# Patient Record
Sex: Female | Born: 1960 | Race: White | Hispanic: No | Marital: Married | Smoking: Former smoker
Health system: Southern US, Community
[De-identification: ages and names within clinical notes are randomized; demographics above are authoritative.]

## PROBLEM LIST (undated history)

## (undated) DIAGNOSIS — I10 Essential (primary) hypertension: Secondary | ICD-10-CM

## (undated) DIAGNOSIS — E785 Hyperlipidemia, unspecified: Secondary | ICD-10-CM

## (undated) DIAGNOSIS — L0291 Cutaneous abscess, unspecified: Secondary | ICD-10-CM

## (undated) DIAGNOSIS — M1990 Unspecified osteoarthritis, unspecified site: Secondary | ICD-10-CM

## (undated) DIAGNOSIS — E119 Type 2 diabetes mellitus without complications: Secondary | ICD-10-CM

## (undated) HISTORY — DX: Cutaneous abscess, unspecified: L02.91

## (undated) HISTORY — DX: Type 2 diabetes mellitus without complications: E11.9

## (undated) HISTORY — DX: Unspecified osteoarthritis, unspecified site: M19.90

## (undated) HISTORY — DX: Hyperlipidemia, unspecified: E78.5

## (undated) HISTORY — DX: Essential (primary) hypertension: I10

---

## 1997-08-11 HISTORY — PX: GANGLION CYST EXCISION: SHX1691

## 1998-01-11 ENCOUNTER — Other Ambulatory Visit: Admission: RE | Admit: 1998-01-11 | Discharge: 1998-01-11 | Payer: Self-pay | Admitting: Obstetrics and Gynecology

## 2000-09-01 ENCOUNTER — Other Ambulatory Visit: Admission: RE | Admit: 2000-09-01 | Discharge: 2000-09-01 | Payer: Self-pay | Admitting: Obstetrics and Gynecology

## 2001-09-20 ENCOUNTER — Other Ambulatory Visit: Admission: RE | Admit: 2001-09-20 | Discharge: 2001-09-20 | Payer: Self-pay | Admitting: Obstetrics and Gynecology

## 2002-02-01 ENCOUNTER — Encounter: Payer: Self-pay | Admitting: Obstetrics and Gynecology

## 2002-02-01 ENCOUNTER — Ambulatory Visit (HOSPITAL_COMMUNITY): Admission: RE | Admit: 2002-02-01 | Discharge: 2002-02-01 | Payer: Self-pay | Admitting: Obstetrics and Gynecology

## 2003-08-15 ENCOUNTER — Other Ambulatory Visit: Admission: RE | Admit: 2003-08-15 | Discharge: 2003-08-15 | Payer: Self-pay | Admitting: Obstetrics and Gynecology

## 2003-09-11 ENCOUNTER — Encounter: Admission: RE | Admit: 2003-09-11 | Discharge: 2003-09-11 | Payer: Self-pay | Admitting: Obstetrics and Gynecology

## 2004-01-18 ENCOUNTER — Encounter: Admission: RE | Admit: 2004-01-18 | Discharge: 2004-01-18 | Payer: Self-pay | Admitting: Obstetrics and Gynecology

## 2004-05-21 ENCOUNTER — Encounter: Admission: RE | Admit: 2004-05-21 | Discharge: 2004-05-21 | Payer: Self-pay | Admitting: Family Medicine

## 2005-01-16 ENCOUNTER — Encounter: Admission: RE | Admit: 2005-01-16 | Discharge: 2005-01-16 | Payer: Self-pay | Admitting: Obstetrics and Gynecology

## 2005-07-16 ENCOUNTER — Inpatient Hospital Stay (HOSPITAL_COMMUNITY): Admission: RE | Admit: 2005-07-16 | Discharge: 2005-07-17 | Payer: Self-pay | Admitting: Obstetrics and Gynecology

## 2005-07-16 ENCOUNTER — Encounter (INDEPENDENT_AMBULATORY_CARE_PROVIDER_SITE_OTHER): Payer: Self-pay | Admitting: Specialist

## 2005-08-11 HISTORY — PX: ABDOMINAL HYSTERECTOMY: SHX81

## 2007-09-14 ENCOUNTER — Other Ambulatory Visit: Admission: RE | Admit: 2007-09-14 | Discharge: 2007-09-14 | Payer: Self-pay | Admitting: Family Medicine

## 2008-11-28 ENCOUNTER — Other Ambulatory Visit: Admission: RE | Admit: 2008-11-28 | Discharge: 2008-11-28 | Payer: Self-pay | Admitting: Family Medicine

## 2010-01-01 ENCOUNTER — Encounter: Admission: RE | Admit: 2010-01-01 | Discharge: 2010-01-01 | Payer: Self-pay | Admitting: Family Medicine

## 2010-01-14 ENCOUNTER — Other Ambulatory Visit: Admission: RE | Admit: 2010-01-14 | Discharge: 2010-01-14 | Payer: Self-pay | Admitting: Family Medicine

## 2010-12-27 NOTE — Op Note (Signed)
NAMECHARLIZE, Samantha Hobbs                 ACCOUNT NO.:  [id number]   MEDICAL RECORD NO.:  [id number]          PATIENT TYPE:  INP   LOCATION:  9399                          FACILITY:  [facility]   PHYSICIAN:  Sherry A. Dickstein, M.D.DATE OF BIRTH:  [birth year]   DATE OF PROCEDURE:  07/16/2005  DATE OF DISCHARGE:                                 OPERATIVE REPORT   PREOPERATIVE DIAGNOSIS:  Fibroid uterus.   POSTOPERATIVE DIAGNOSIS:  Fibroid uterus.   PROCEDURE:  Laparoscopic supracervical hysterectomy.   SURGEON:  Sherry A. Rosalio Macadamia, M.D. and Gerri Spore B. Earlene Plater, M.D.   ANESTHESIA:  General.   INDICATIONS:  This is a 50 year old gravida 1, para 0-0-1-0 woman who has  had a known fibroid uterus for approximately 8 years. The patient's  menstrual period has been getting slightly heavier but the patient has been  complaining of increasing abdominal pain, back pain and pelvic pressure. The  patient has had ultrasounds which show increasing size of the uterus.  Because of the increase in size of the uterus and the discomfort, the  patient is ready to have surgical intervention. Initially she requested  myomectomy but because the patient is not interested in any pregnancies, the  patient requested hysterectomy at this time. She requested leaving her  cervix in place and therefore requests a laparoscopic supracervical  hysterectomy if at all possible.   FINDINGS:  Approximately 11-12 weeks size uterus with one pedunculated  fibroid approximately 10 cm in diameter. The retroverted uterus with a large  fibroid off the posterior wall, normal fallopian tubes and ovaries, some  adhesions of the large intestine to the upper right abdomen.   PROCEDURE:  The patient is brought into the operating room, given adequate  general anesthesia. She was placed in the dorsal lithotomy position. Her  abdomen, perineum and vagina were washed with Betadine and the patient was  draped in sterile fashion after inserting a  Foley catheter. The surgeon's  gown and gloves were changed. Subumbilical area was infiltrated with  Marcaine. Incision was made. Fascia was incised. Fascial edges were  identified and a pursestring stitch was taken with 0 Vicryl. The peritoneum  was identified and entered bluntly. The Roseanne Reno was introduced into the  peritoneal cavity. The laparoscope was introduced and the pelvic organs were  identified. Carbon dioxide was insufflated. The left median incision was  made after infiltrating with 0.25% Marcaine and under direct visual  visualization, 5 mm trocar was introduced. Pelvis was inspected. Adhesions  on the right side of the abdominal cavity were identified. These were  dissected off of the abdominal cavity with sharp and blunt dissection  including unipolar cautery very carefully. The bowel and adhesions were  dropped down into the abdominal cavity. Using 0.25% Marcaine, a 10 mm  incision was made and under direct visualization a 10 mm trocar was  introduced into the peritoneal cavity. The pelvis was again inspected and  decision was made that [facility] could be performed. The right fallopian tube and  round ligament were cauterized and cut using the harmonic scalpel. The utero-  ovarian ligaments were then  cauterized using minimum power. The broad  ligament was cauterized and the anterior leaf of broad ligament was  cauterized dropping the bladder down off of the lower uterine segment. The  uterine artery was easily identified and this was cauterized using minimal  power as it was easily identified. The left side of the uterus was  identified. There was a large pedunculated fibroid. The base of the  pedunculated fibroid was visualized. It was cauterized initially using  minimal power on the harmonic scalpel. This bled very minimally. Using  Kleppingers with bipolar cautery, the base was cauterized with adequate  hemostasis. The fibroid was dissected off of the uterus in this fashion  and  just left to be able to be morcellated later. The left round ligament and  left utero-ovarian ligaments were cauterized and cut using the harmonic  scalpel on minimum. This was performed down to the uterine artery which was  very easily visualized. The anterior leaf of the broad ligament was  cauterized and skeletonized. The bladder was developed off the lower uterine  segment with blunt dissection. There was small amount of bleeding near the  uterine arteries. This was cauterized with bipolar cautery and adequate  hemostasis was present. The reverse cone technique was developed from the  right side of the uterus because this was the most easily seen anatomy using  the harmonic scalpel. The cervix was dissected above the uterine artery and  above the uterosacral ligaments across the cervix. The large posterior  fibroid was in the way to some degree but once this was manipulated, the  posterior serosa was also dissected free and cauterized with a harmonic  scalpel and the uterus and fibroids were dissected free from the cervix  using a harmonic scalpel over to the left side of the cervix. The fundus of  the uterus was completely separated in this fashion. There was small amount  of bleeding near the left uterine artery. This was cauterized with the  bipolar cautery and also a small amount of bleeding near the right uterine  artery. This was cauterized for adequate hemostasis. The right sided trocar  was removed and the morcellator was then placed under direct visualization.  The uterus was then morcellated in strips. The large fibroid that was  present was very calcified and very difficult to remove. The morcellator  began to be locked up and had some technical difficulties in removing this  fibroid and making the morcellated work properly. Once this was removed  completely, it was attempted to start morcellating the second large pedunculated fibroid that was as large as the original  uterus. It was felt  that the morcellated was not working properly and was removed and a new  morcellated was used and after it was switched to the new morcellated, the  second pedunculated fibroid was then morcellated without difficulty. The  small pieces of fibroid that were throughout the pelvis were identified and  removed using the large spoon-like instrument and atraumatic graspers. The  pelvis was irrigated. All small pieces were removed. Small bleeders were  cauterized. The endocervix was then cauterized with the harmonic scalpel on  max for approximately 25 seconds. The entire pelvis was inspected. The upper  abdomen was inspected. Any other pieces of fibroid were removed. The area of  adhesions that had been dissected at the very beginning of surgery was  inspected. There was no significant bleeding present. Both ovaries were  visualized and felt to be normal. There was follicular cyst  on the right  ovary that was felt to be normal. Both fallopian tubes were normal. Toradol  was administered to the patient at this time. All fluid was removed from the  abdomen. Approximately 20 mL of 0.25% Marcaine was placed in the pelvis. A  sheet of intercede was placed across the cervical stump to minimize any  adhesions. The morcellator was removed under direct visualization. The edges  of the fascia were identified and Kocher clamps placed on the edges using 0  Vicryl figure-of-eight stitch was taken and tied. The rest of the remaining  carbon dioxide was allowed to escape and all instruments were removed from  the small incisions. The subumbilical incision was closed with the  pursestring stitch that had been placed initially. The skin incisions were  closed using 4-0 Monocryl in subcuticular running stitch and the skin  incisions were also closed with Dermabond. Adequate hemostasis was present.  The patient was then taken out of dorsal lithotomy position. She was  awakened. She was  extubated. She was moved from the operating table to a  stretcher in stable condition. Complications were none. Estimated blood loss  50-100 mL. Specimen was uterus equal to 560.2 grams.      Sherry A. Rosalio Macadamia, M.D.  Electronically Signed     SAD/MEDQ  D:  07/16/2005  T:  07/17/2005  Job:  [id number]

## 2011-01-16 ENCOUNTER — Other Ambulatory Visit (HOSPITAL_COMMUNITY)
Admission: RE | Admit: 2011-01-16 | Discharge: 2011-01-16 | Disposition: A | Discharge status: Home / Self Care | Payer: BC Managed Care – PPO | Source: Ambulatory Visit | Attending: Family Medicine | Admitting: Family Medicine

## 2011-01-16 ENCOUNTER — Other Ambulatory Visit: Payer: Self-pay | Admitting: Family Medicine

## 2011-01-16 DIAGNOSIS — Z124 Encounter for screening for malignant neoplasm of cervix: Secondary | ICD-10-CM | POA: Insufficient documentation

## 2011-04-04 ENCOUNTER — Ambulatory Visit (INDEPENDENT_AMBULATORY_CARE_PROVIDER_SITE_OTHER): Payer: BC Managed Care – PPO | Admitting: General Surgery

## 2011-04-04 ENCOUNTER — Encounter (INDEPENDENT_AMBULATORY_CARE_PROVIDER_SITE_OTHER): Payer: Self-pay | Admitting: General Surgery

## 2011-04-04 VITALS — BP 130/86 | HR 100 | Temp 99.0°F | Ht 69.75 in | Wt 202.2 lb

## 2011-04-04 DIAGNOSIS — L0231 Cutaneous abscess of buttock: Secondary | ICD-10-CM

## 2011-04-04 DIAGNOSIS — L03317 Cellulitis of buttock: Secondary | ICD-10-CM

## 2011-04-04 MED ORDER — DOXYCYCLINE HYCLATE 100 MG PO TABS: 100.0000 mg | ORAL_TABLET | Freq: Two times a day (BID) | ORAL | Status: AC

## 2011-04-04 MED ORDER — OXYCODONE-ACETAMINOPHEN 5-325 MG PO TABS: 1.0000 | ORAL_TABLET | ORAL | Status: AC | PRN

## 2011-04-04 NOTE — Patient Instructions (Signed)
Take antibiotic as directed.Abscess/Boil Care After (Furuncle) An abscess (also called a boil or furuncle) is an infected area that contains a collection of pus. Signs and symptoms of an abscess include pain, tenderness, redness, or hardness, or you may feel a moveable soft area under your skin. An abscess can occur anywhere in the body. The infection may spread to surrounding tissues causing cellulitis. A cut (incision) by the surgeon was made over your abscess and the pus was drained out. Gauze may have been packed into the space to provide a drain that will allow the cavity to heal from the inside outwards. The boil may be painful for 5 to 7 days. Most people with a boil do not have high fevers. Your abscess, if seen early, may not have localized, and may not have been lanced. If not, another appointment may be required for this if it does not get better on its own or with medications. HOME CARE INSTRUCTIONS  Only take over-the-counter or prescription medicines for pain, discomfort, or fever as directed by your caregiver.   When you bathe, soak and then remove gauze or iodoform packs.  You may then wash the wound gently with mild soapy water. Cover with gauze or do as your caregiver directs.  SEEK IMMEDIATE MEDICAL CARE IF:  You develop increased pain, swelling, redness, drainage, or bleeding in the wound site.   You develop signs of generalized infection including muscle aches, chills, fever, or a general ill feeling.   An oral temperature above 101.5 develops, not controlled by medication.  See your caregiver for a recheck if you develop any of the symptoms described above. If medications (antibiotics) were prescribed, take them as directed. Document Released: 02/13/2005 Document Re-Released: 01/15/2010 [facility] Patient Information 2011 [place], [place].

## 2011-04-04 NOTE — Progress Notes (Signed)
Chief complaint: Right buttock abscess  History of present illness: 50 year old Caucasian female comes in complaining of right buttock pain. She believes she has an abscess. She was referred by her primary care physician's office. She states that it started on Wednesday. The pain has gotten progressively worse. She denies any trauma to the area. She denies any previous abscesses. She endorses some subjective fevers and chills. She had a bowel movement earlier today. She had to leave work early due to the pain.  Past medical, surgical, social, and family histories are reviewed and documented in the electronic medical record  Review of systems: A 10 point review of systems was performed all systems are negative except as mentioned in the history of present illness.  Physical exam: BP 130/86  Pulse 100  Temp(Src) 99 F (37.2 C) (Temporal)  Ht 5' 9.75" (1.772 m)  Wt 202 lb 3.2 oz (91.717 kg)  BMI 29.22 kg/m2  Well-developed well-nourished Caucasian female in no apparent distress Pulmonary-lungs are clear to auscultation Cardiac-regular rate and rhythm Abdomen-soft, nontender, nondistended distended Rectal-she has a large area of erythema and induration on the right buttock in the anterior lateral position. There is approximately 8 cm of cellulitis. There is a raised area in the center point of the cellulitis. There is some fluctuance. Digital rectal exam was deferred  Assessment and plan: 50 year old Caucasian female with hypertension, hyperlipidemia, and a right buttock abscess.  After obtaining verbal consent, her right buttock was prepped with Betadine. I then infiltrated a total of 18 cc of 1% Xylocaine with epi mixed with bicarbonate. I then made a 2 inch incision over the maximal area of induration. There is a large cavity. There was drainage of pus. The patient tolerated the procedure well. The wound was packed with iodoform gauze. It was covered with 4 x 4's.  I wrote  for a  prescription for doxycycline for 10 days. She was also given a prescription for Percocet. She was given wound care instructions.  She will followup in 10 days. She was instructed to call for worsening fever, worsening pain, excessive bleeding, or any questions or concerns

## 2011-04-15 ENCOUNTER — Encounter (INDEPENDENT_AMBULATORY_CARE_PROVIDER_SITE_OTHER): Payer: Self-pay | Admitting: Surgery

## 2011-04-15 ENCOUNTER — Ambulatory Visit (INDEPENDENT_AMBULATORY_CARE_PROVIDER_SITE_OTHER): Payer: BC Managed Care – PPO | Admitting: Surgery

## 2011-04-15 VITALS — BP 142/86 | HR 58 | Temp 97.6°F | Ht 70.0 in | Wt 205.0 lb

## 2011-04-15 DIAGNOSIS — L0231 Cutaneous abscess of buttock: Secondary | ICD-10-CM

## 2011-04-15 DIAGNOSIS — L03317 Cellulitis of buttock: Secondary | ICD-10-CM

## 2011-04-15 NOTE — Progress Notes (Signed)
Visit Diagnoses: 1. Cellulitis and abscess of buttock     HISTORY: Patient returns for followup having undergone incision and drainage of right back abscess 10 days ago. She completed her course of oral doxycycline. She has noted improvement of pain. She is having minimal drainage.   EXAM: Surgical wound on right buttock has closed. There is a dry eschar. Wound is explored with a Q-tip. There is no undrained collection noted. Cellulitis has resolved. There is no significant tenderness.   IMPRESSION: Resolved right buttock abscess   PLAN: Local wound instructions are given. Patient will continue daily shower. She can apply a moisturizing cream to the area for dry skin. She will return as needed.   Velora Heckler, MD, FACS General & Endocrine Surgery [facility]

## 2011-05-12 ENCOUNTER — Other Ambulatory Visit: Payer: Self-pay | Admitting: Family Medicine

## 2011-05-12 DIAGNOSIS — Z1231 Encounter for screening mammogram for malignant neoplasm of breast: Secondary | ICD-10-CM

## 2011-06-02 ENCOUNTER — Ambulatory Visit
Admission: RE | Admit: 2011-06-02 | Discharge: 2011-06-02 | Disposition: A | Discharge status: Home / Self Care | Payer: BC Managed Care – PPO | Source: Ambulatory Visit | Attending: Family Medicine | Admitting: Family Medicine

## 2011-06-02 DIAGNOSIS — Z1231 Encounter for screening mammogram for malignant neoplasm of breast: Secondary | ICD-10-CM

## 2012-12-13 ENCOUNTER — Other Ambulatory Visit: Payer: Self-pay

## 2012-12-13 DIAGNOSIS — Z1231 Encounter for screening mammogram for malignant neoplasm of breast: Secondary | ICD-10-CM

## 2013-01-19 ENCOUNTER — Ambulatory Visit
Admission: RE | Admit: 2013-01-19 | Discharge: 2013-01-19 | Disposition: A | Discharge status: Home / Self Care | Payer: BC Managed Care – PPO | Source: Ambulatory Visit

## 2013-01-19 DIAGNOSIS — Z1231 Encounter for screening mammogram for malignant neoplasm of breast: Secondary | ICD-10-CM

## 2013-08-15 ENCOUNTER — Encounter (INDEPENDENT_AMBULATORY_CARE_PROVIDER_SITE_OTHER): Payer: Self-pay | Admitting: Surgery

## 2013-08-15 ENCOUNTER — Ambulatory Visit (INDEPENDENT_AMBULATORY_CARE_PROVIDER_SITE_OTHER): Payer: BC Managed Care – PPO | Admitting: Surgery

## 2013-08-15 ENCOUNTER — Telehealth (INDEPENDENT_AMBULATORY_CARE_PROVIDER_SITE_OTHER): Payer: Self-pay | Admitting: Surgery

## 2013-08-15 ENCOUNTER — Encounter (INDEPENDENT_AMBULATORY_CARE_PROVIDER_SITE_OTHER): Payer: Self-pay

## 2013-08-15 VITALS — BP 126/74 | HR 68 | Temp 98.4°F | Resp 16 | Ht 70.0 in | Wt 208.8 lb

## 2013-08-15 DIAGNOSIS — D171 Benign lipomatous neoplasm of skin and subcutaneous tissue of trunk: Secondary | ICD-10-CM | POA: Insufficient documentation

## 2013-08-15 DIAGNOSIS — D1720 Benign lipomatous neoplasm of skin and subcutaneous tissue of unspecified limb: Secondary | ICD-10-CM | POA: Insufficient documentation

## 2013-08-15 DIAGNOSIS — D1739 Benign lipomatous neoplasm of skin and subcutaneous tissue of other sites: Secondary | ICD-10-CM

## 2013-08-15 DIAGNOSIS — D1779 Benign lipomatous neoplasm of other sites: Secondary | ICD-10-CM

## 2013-08-15 NOTE — Progress Notes (Signed)
General Surgery [facility]  Chief Complaint  Patient presents with  . 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 Kelton Pillar    HISTORY: Patient is a 53 year old female referred by her primary care physician for evaluation of lipomas involving the left thigh and left chest wall.  The second and present for a number of years. They have not changed significantly in size. They do not cause discomfort. Patient does not like her cosmetic appearance. She requests surgical excision.  Past Medical History  Diagnosis Date  . Arthritis   . Hyperlipidemia   . Hypertension   . Abscess     Current Outpatient Prescriptions  Medication Sig Dispense Refill  . CALCIUM PO Take by mouth.      . Cholecalciferol (VITAMIN D PO) Take 50,000 Units by mouth once a week.        . co-enzyme Q-10 30 MG capsule Take 30 mg by mouth 3 (three) times daily.      Marland Kitchen estradiol (VIVELLE-DOT) 0.025 MG/24HR Place 1 patch onto the skin once a week.        . hydrochlorothiazide (HYDRODIURIL) 12.5 MG tablet Take 12.5 mg by mouth daily.        Marland Kitchen MAGNESIUM PO Take by mouth.      . naproxen sodium (ANAPROX) 220 MG tablet Take 220 mg by mouth 2 (two) times daily with a meal.      . Omega-3 Fatty Acids (FISH OIL PO) Take by mouth daily.        Marland Kitchen omeprazole (PRILOSEC) 40 MG capsule       . Vitamin D, Ergocalciferol, (DRISDOL) 50000 UNITS CAPS        No current facility-administered medications for this visit.    No Known Allergies  Family History  Problem Relation Age of Onset  . Diabetes Father     History   Social History  . Marital Status: Married    Spouse Name: N/A    Number of Children: N/A  . Years of Education: N/A   Social History Main Topics  . Smoking status: Former Research scientist (life sciences)  . Smokeless tobacco: None     Comment: 2002- quit  . Alcohol Use: No  . Drug Use: No  . Sexual Activity:    Other Topics Concern  . None   Social History Narrative   . None    REVIEW OF SYSTEMS - PERTINENT POSITIVES ONLY: Denies pain. Denies changes in size. No previous such lesions excised.  EXAM: Filed Vitals:   08/15/13 1513  BP: 126/74  Pulse: 68  Temp: 98.4 F (36.9 C)  Resp: 16    GENERAL: well-developed, well-nourished, no acute distress HEENT: normocephalic; pupils equal and reactive; sclerae clear; dentition good; mucous membranes moist NECK:  symmetric on extension; no palpable anterior or posterior cervical lymphadenopathy; no supraclavicular masses; no tenderness CHEST: clear to auscultation bilaterally without rales, rhonchi, or wheezes; 1.0 cm soft tissue mass at the edge of the left pectoralis major muscle laterally consistent with lipoma CARDIAC: regular rate and rhythm without significant murmur; peripheral pulses are full ABDOMEN: soft without distension; bowel sounds present; no mass; no hepatosplenomegaly; no hernia EXT:  non-tender without edema; no deformity; 2.0 cm mass left anterior thigh just below the inguinal crease consistent with lipoma NEURO: no gross focal deficits; no sign of tremor   LABORATORY RESULTS: See [facility] ([facility]-Epic) for most recent results  RADIOLOGY RESULTS: See [facility] (CH[facility]-Epic)  for most recent results  IMPRESSION: #1 probable lipoma, left chest wall, 1 cm #2 probable lipoma, left anterior thigh, 2 cm  PLAN: Patient and I discussed surgical excision under sedation and local anesthetic. We discussed the size and location of the surgical incision. We discussed sending these to pathology for review at the time of excision. Patient agrees and wishes to proceed. We'll make arrangements for outpatient surgery at a time convenient for the patient in the near future.  The risks and benefits of the procedure have been discussed at length with the patient.  The patient understands the proposed procedure, potential alternative treatments, and the course of recovery to be expected.  All of  the patient's questions have been answered at this time.  The patient wishes to proceed with surgery.  Earnstine Regal, MD, [facility]  Primary Care Physician: Osborne Casco, MD

## 2013-08-15 NOTE — Patient Instructions (Signed)

## 2013-08-15 NOTE — Telephone Encounter (Signed)
Patient met with surgery scheduling went over financial responsibilities, will call back to schedule °

## 2015-06-14 ENCOUNTER — Other Ambulatory Visit: Payer: Self-pay | Admitting: Family Medicine

## 2015-06-14 ENCOUNTER — Other Ambulatory Visit (HOSPITAL_COMMUNITY)
Admission: RE | Admit: 2015-06-14 | Discharge: 2015-06-14 | Disposition: A | Discharge status: Home / Self Care | Payer: BC Managed Care – PPO | Source: Ambulatory Visit | Attending: Family Medicine | Admitting: Family Medicine

## 2015-06-14 DIAGNOSIS — Z124 Encounter for screening for malignant neoplasm of cervix: Secondary | ICD-10-CM | POA: Diagnosis not present

## 2015-06-18 LAB — CYTOLOGY - PAP

## 2015-10-10 ENCOUNTER — Ambulatory Visit (INDEPENDENT_AMBULATORY_CARE_PROVIDER_SITE_OTHER): Payer: BC Managed Care – PPO

## 2015-10-10 DIAGNOSIS — R002 Palpitations: Secondary | ICD-10-CM | POA: Diagnosis not present

## 2015-12-10 ENCOUNTER — Ambulatory Visit (INDEPENDENT_AMBULATORY_CARE_PROVIDER_SITE_OTHER): Payer: BC Managed Care – PPO | Admitting: Cardiology

## 2015-12-10 ENCOUNTER — Encounter: Payer: Self-pay | Admitting: Cardiology

## 2015-12-10 VITALS — BP 126/88 | HR 77 | Ht 69.5 in | Wt 215.2 lb

## 2015-12-10 DIAGNOSIS — R002 Palpitations: Secondary | ICD-10-CM | POA: Diagnosis not present

## 2015-12-10 NOTE — Progress Notes (Signed)
Electrophysiology Office Note   Date:  12/10/2015   ID:  PAIDYN ASFOUR, DOB June 26, 1961, MRN [id number]  PCP:  Osborne Casco, MD  Primary Electrophysiologist:  Nameer Summer Meredith Leeds, MD    Chief Complaint  Patient presents with  . Palpitations  . New Patient (Initial Visit)     History of Present Illness: Briane BRIONNA BRUGH is a 55 y.o. female who presents today for electrophysiology evaluation.   She presents for episodes of palpitations she says that 2-1/2 months ago, she started having palpitations and skipped beats that had been worsening. She says that she went to her primary physician's office and adjusted some medications in the same complaints and he set her up for a monitor to be placed. Her monitor showed episodes of APCs, PVCs, and at times possibly Mobitz 2 heart block. She said that she was not taking her omeprazole the time of the worst of her symptoms, but since she has restarted the medication she has felt much better.   Today, she denies symptoms of palpitations, chest pain, shortness of breath, orthopnea, PND, lower extremity edema, claudication, dizziness, presyncope, syncope, bleeding, or neurologic sequela. The patient is tolerating medications without difficulties and is otherwise without complaint today.    Past Medical History  Diagnosis Date  . Arthritis   . Hyperlipidemia   . Hypertension   . Abscess    Past Surgical History  Procedure Laterality Date  . Abdominal hysterectomy  2007    supercervical  . Ganglion cyst excision  1999     Current Outpatient Prescriptions  Medication Sig Dispense Refill  . ALPRAZolam (XANAX) 0.25 MG tablet Take 0.25 mg by mouth daily as needed.  0  . CALCIUM PO Take by mouth.    . Cholecalciferol (VITAMIN D PO) Take 50,000 Units by mouth once a week.      . co-enzyme Q-10 30 MG capsule Take 30 mg by mouth 3 (three) times daily.    Marland Kitchen estradiol (ESTRACE) 0.5 MG tablet Take 1 tablet by mouth daily.  5  .  hydrochlorothiazide (HYDRODIURIL) 12.5 MG tablet Take 12.5 mg by mouth daily.      Marland Kitchen MAGNESIUM PO Take by mouth.    . naproxen sodium (ANAPROX) 220 MG tablet Take 220 mg by mouth 2 (two) times daily with a meal.    . Omega-3 Fatty Acids (FISH OIL PO) Take by mouth daily.      Marland Kitchen omeprazole (PRILOSEC) 40 MG capsule     . Vitamin D, Ergocalciferol, (DRISDOL) 50000 UNITS CAPS      No current facility-administered medications for this visit.    Allergies:   Review of patient's allergies indicates no known allergies.   Social History:  The patient  reports that she has quit smoking. She does not have any smokeless tobacco history on file. She reports that she does not drink alcohol or use illicit drugs.   Family History:  The patient's family history includes Diabetes in her father.    ROS:  Please see the history of present illness.   Otherwise, review of systems is positive for joint swelling.   All other systems are reviewed and negative.    PHYSICAL EXAM: VS:  BP 126/88 mmHg  Pulse 77  Ht 5' 9.5" (1.765 m)  Wt 215 lb 3.2 oz (97.614 kg)  BMI 31.33 kg/m2 , BMI Body mass index is 31.33 kg/(m^2). GEN: Well nourished, well developed, in no acute distress HEENT: normal Neck: no JVD, carotid bruits, or masses  Cardiac: RRR; no murmurs, rubs, or gallops,no edema  Respiratory:  clear to auscultation bilaterally, normal work of breathing GI: soft, nontender, nondistended, + BS MS: no deformity or atrophy Skin: warm and dry Neuro:  Strength and sensation are intact Psych: euthymic mood, full affect  EKG:  EKG is ordered today. The ekg ordered today shows sinus rhythm, rate 77  Recent Labs: No results found for requested labs within last 365 days.    Lipid Panel  No results found for: CHOL, TRIG, HDL, CHOLHDL, VLDL, LDLCALC, LDLDIRECT   Wt Readings from Last 3 Encounters:  12/10/15 215 lb 3.2 oz (97.614 kg)  08/15/13 208 lb 12.8 oz (94.711 kg)  04/15/11 205 lb (92.987 kg)       Other studies Reviewed: Additional studies/ records that were reviewed today include: Event monitor 10/10/15  Review of the above records today demonstrates:  SInus rhythm with PVCs, PACs  Symptoms of palpitations, skips often associated with skips   ASSESSMENT AND PLAN:  1.  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 I discussed with her further options of treatment, including pacemaker placement. She is minimally symptomatic at this time, and has a normal EKG. At this time, she is not excited about pacemaker placement. I have told her that it is likely okay to continue to monitor her heart rhythm at this time. I have told her that if she gets worsening symptoms, or passes out, that she should call the clinic back and be seen. She has not had syncope to this point.   Current medicines are reviewed at length with the patient today.   The patient does not have concerns regarding her medicines.  The following changes were made today:  none  Labs/ tests ordered today include:  No orders of the defined types were placed in this encounter.     Disposition:   FU with Jamale Spangler 6 months  Signed, Marshal Eskew Meredith Leeds, MD  12/10/2015 3:35 PM     [place] [address] [phone] (office) [phone] (fax)

## 2015-12-10 NOTE — Patient Instructions (Signed)
Medication Instructions:    Your physician recommends that you continue on your current medications as directed. Please refer to the Current Medication list given to you today.  Labwork:  None ordered  Testing/Procedures:  None ordered  Follow-Up:  Your physician wants you to follow-up in: 6 months with Dr. Camnitz.  You will receive a reminder letter in the mail two months in advance. If you don't receive a letter, please call our office to schedule the follow-up appointment.  - If you need a refill on your cardiac medications before your next appointment, please call your pharmacy.    Thank you for choosing [facility]!!   Stepheny Canal, RN [phone]         

## 2015-12-12 NOTE — Addendum Note (Signed)
Addended by: Freada Bergeron on: 12/12/2015 05:47 PM   Modules accepted: Orders

## 2016-12-04 ENCOUNTER — Encounter: Payer: BC Managed Care – PPO | Attending: Family Medicine | Admitting: Dietician

## 2016-12-04 ENCOUNTER — Encounter: Payer: Self-pay | Admitting: Dietician

## 2016-12-04 DIAGNOSIS — Z713 Dietary counseling and surveillance: Secondary | ICD-10-CM | POA: Diagnosis present

## 2016-12-04 DIAGNOSIS — E119 Type 2 diabetes mellitus without complications: Secondary | ICD-10-CM

## 2016-12-04 NOTE — Progress Notes (Signed)

## 2016-12-11 ENCOUNTER — Encounter: Payer: BC Managed Care – PPO | Attending: Family Medicine | Admitting: Dietician

## 2016-12-11 DIAGNOSIS — E119 Type 2 diabetes mellitus without complications: Secondary | ICD-10-CM | POA: Insufficient documentation

## 2016-12-11 DIAGNOSIS — Z713 Dietary counseling and surveillance: Secondary | ICD-10-CM | POA: Insufficient documentation

## 2016-12-11 NOTE — Progress Notes (Signed)

## 2016-12-18 ENCOUNTER — Encounter: Payer: BC Managed Care – PPO | Admitting: Dietician

## 2016-12-18 DIAGNOSIS — Z713 Dietary counseling and surveillance: Secondary | ICD-10-CM | POA: Diagnosis not present

## 2016-12-18 DIAGNOSIS — E119 Type 2 diabetes mellitus without complications: Secondary | ICD-10-CM

## 2016-12-18 NOTE — Progress Notes (Signed)
Patient was seen on 12/18/16 for the third of a series of three diabetes self-management courses at the [facility].   Samantha Hobbs the amount of activity recommended for healthy living . Describe activities suitable for individual needs . Identify ways to regularly incorporate activity into daily life . Identify barriers to activity and ways to over come these barriers  Identify diabetes medications being personally used and their primary action for lowering glucose and possible side effects . Describe role of stress on blood glucose and develop strategies to address psychosocial issues . Identify diabetes complications and ways to prevent them  Explain how to manage diabetes during illness . Evaluate success in meeting personal goal . Establish 2-3 goals that they will plan to diligently work on until they return for the  2-month follow-up visit  Goals:   I will count my carb choices at most meals and snacks  I will be active 20 minutes or more 3 times a week  To help manage stress I will read before bed at least 3 times a week  Your patient has identified these potential barriers to change:  Work load/emergencies to deal with that make me work through lunch  Your patient has identified their diabetes self-care support plan as  [employer] support Plan:  Attend Support Group as desired

## 2017-04-27 DIAGNOSIS — IMO0002 Reserved for concepts with insufficient information to code with codable children: Secondary | ICD-10-CM | POA: Insufficient documentation

## 2017-04-27 DIAGNOSIS — M67432 Ganglion, left wrist: Secondary | ICD-10-CM | POA: Insufficient documentation

## 2017-04-27 DIAGNOSIS — M79642 Pain in left hand: Secondary | ICD-10-CM | POA: Insufficient documentation

## 2017-04-27 DIAGNOSIS — M19042 Primary osteoarthritis, left hand: Secondary | ICD-10-CM | POA: Insufficient documentation

## 2017-04-27 DIAGNOSIS — M1812 Unilateral primary osteoarthritis of first carpometacarpal joint, left hand: Secondary | ICD-10-CM | POA: Insufficient documentation

## 2017-06-04 ENCOUNTER — Other Ambulatory Visit: Payer: Self-pay | Admitting: Physician Assistant

## 2017-06-04 ENCOUNTER — Other Ambulatory Visit: Payer: Self-pay | Admitting: Family Medicine

## 2017-06-04 ENCOUNTER — Ambulatory Visit
Admission: RE | Admit: 2017-06-04 | Discharge: 2017-06-04 | Disposition: A | Discharge status: Home / Self Care | Payer: BC Managed Care – PPO | Source: Ambulatory Visit | Attending: Family Medicine | Admitting: Family Medicine

## 2017-06-04 DIAGNOSIS — R05 Cough: Secondary | ICD-10-CM

## 2017-06-04 DIAGNOSIS — R059 Cough, unspecified: Secondary | ICD-10-CM

## 2017-11-12 ENCOUNTER — Other Ambulatory Visit (HOSPITAL_COMMUNITY)
Admission: RE | Admit: 2017-11-12 | Discharge: 2017-11-12 | Disposition: A | Discharge status: Home / Self Care | Payer: BC Managed Care – PPO | Source: Ambulatory Visit | Attending: Family Medicine | Admitting: Family Medicine

## 2017-11-12 ENCOUNTER — Other Ambulatory Visit: Payer: Self-pay | Admitting: Family Medicine

## 2017-11-12 DIAGNOSIS — Z124 Encounter for screening for malignant neoplasm of cervix: Secondary | ICD-10-CM | POA: Insufficient documentation

## 2017-11-13 LAB — CYTOLOGY - PAP
DIAGNOSIS: NEGATIVE
HPV: NOT DETECTED

## 2018-07-23 DIAGNOSIS — M25562 Pain in left knee: Secondary | ICD-10-CM | POA: Insufficient documentation

## 2018-09-30 ENCOUNTER — Other Ambulatory Visit: Payer: Self-pay | Admitting: Family Medicine

## 2018-09-30 ENCOUNTER — Ambulatory Visit
Admission: RE | Admit: 2018-09-30 | Discharge: 2018-09-30 | Disposition: A | Discharge status: Home / Self Care | Payer: BC Managed Care – PPO | Source: Ambulatory Visit | Attending: Family Medicine | Admitting: Family Medicine

## 2018-09-30 DIAGNOSIS — Z1231 Encounter for screening mammogram for malignant neoplasm of breast: Secondary | ICD-10-CM

## 2019-03-14 ENCOUNTER — Telehealth: Payer: Self-pay

## 2019-03-14 NOTE — Telephone Encounter (Signed)
Unable to reach patient to ask screening questions.

## 2019-03-15 ENCOUNTER — Ambulatory Visit: Payer: BC Managed Care – PPO | Admitting: Cardiology

## 2019-03-15 ENCOUNTER — Encounter: Payer: Self-pay | Admitting: Cardiology

## 2019-03-15 ENCOUNTER — Other Ambulatory Visit: Payer: Self-pay

## 2019-03-15 VITALS — BP 126/62 | HR 84 | Ht 69.5 in | Wt 185.0 lb

## 2019-03-15 DIAGNOSIS — R002 Palpitations: Secondary | ICD-10-CM

## 2019-03-15 NOTE — Progress Notes (Signed)
Electrophysiology Office Note   Date:  03/15/2019   ID:  Samantha Hobbs, DOB 11/27/1960, MRN [id number]  PCP:  Samantha Pillar, MD  Primary Electrophysiologist:  Samantha Hobin Meredith Leeds, MD    No chief complaint on file.    History of Present Illness: Samantha Hobbs is a 58 y.o. female who presents today for electrophysiology evaluation.   She presents for the evaluation of palpitations being referred by Samantha Hobbs.  2017, she wore a cardiac monitor that showed PACs and PVCs.  Today, denies symptoms of palpitations, chest pain, shortness of breath, orthopnea, PND, lower extremity edema, claudication, dizziness, presyncope, syncope, bleeding, or neurologic sequela. The patient is tolerating medications without difficulties.  She has not had any further episodes of palpitations since 2017.  She feels that this was all due to a stressful point in her life.  Her mother had died, her husband had multiple strokes, and she was trying to get a new job.  She has done much better since then and has lost up to 40 pounds using the [place] diet.   Past Medical History:  Diagnosis Date  . Abscess   . Arthritis   . Diabetes mellitus without complication ([place])   . Hyperlipidemia   . Hypertension    Past Surgical History:  Procedure Laterality Date  . ABDOMINAL HYSTERECTOMY  2007   supercervical  . GANGLION CYST EXCISION  1999     Current Outpatient Medications  Medication Sig Dispense Refill  . ALPRAZolam (XANAX) 0.25 MG tablet Take 0.25 mg by mouth daily as needed.  0  . CALCIUM PO Take by mouth.    . estradiol (ESTRACE) 0.5 MG tablet Take 1 tablet by mouth daily.  5  . hydrochlorothiazide (HYDRODIURIL) 12.5 MG tablet Take 12.5 mg by mouth daily.      . metFORMIN (GLUCOPHAGE) 500 MG tablet Take 500-1,000 mg by mouth 2 (two) times daily. 500MG  IN THE AM, 100MG  IN THE EVENING    . naproxen sodium (ANAPROX) 220 MG tablet Take 220 mg by mouth 2 (two) times daily with a meal.    .  Semaglutide (OZEMPIC, 1 MG/DOSE, [place]) Inject into the skin once a week.    . Vitamin D, Ergocalciferol, (DRISDOL) 50000 UNITS CAPS      No current facility-administered medications for this visit.     Allergies:   Patient has no known allergies.   Social History:  The patient  reports that she has quit smoking. She has never used smokeless tobacco. She reports that she does not drink alcohol or use drugs.   Family History:  The patient's family history includes Diabetes in her father.   ROS:  Please see the history of present illness.   Otherwise, review of systems is positive for none.   All other systems are reviewed and negative.   PHYSICAL EXAM: VS:  BP 126/62   Pulse 84   Ht 5' 9.5" (1.765 m)   Wt 185 lb (83.9 kg)   BMI 26.93 kg/m  , BMI Body mass index is 26.93 kg/m. GEN: Well nourished, well developed, in no acute distress  HEENT: normal  Neck: no JVD, carotid bruits, or masses Cardiac: RRR; no murmurs, rubs, or gallops,no edema  Respiratory:  clear to auscultation bilaterally, normal work of breathing GI: soft, nontender, nondistended, + BS MS: no deformity or atrophy  Skin: warm and dry Neuro:  Strength and sensation are intact Psych: euthymic mood, full affect  EKG:  EKG is ordered today.  Personal review of the ekg ordered shows sinus rhythm, rate 84  Recent Labs: No results found for requested labs within last 8760 hours.    Lipid Panel  No results found for: CHOL, TRIG, HDL, CHOLHDL, VLDL, LDLCALC, LDLDIRECT   Wt Readings from Last 3 Encounters:  03/15/19 185 lb (83.9 kg)  12/10/15 215 lb 3.2 oz (97.6 kg)  08/15/13 208 lb 12.8 oz (94.7 kg)      Other studies Reviewed: Additional studies/ records that were reviewed today include: Monitor 10/29/2015 personally reviewed Review of the above records today demonstrates:  SInus rhythm with PVCs, PACs   Symptoms of palpitations, skips often associated with skips  ASSESSMENT AND PLAN:  1.  Palpitations  fortunately no further episodes of palpitations.  She is able to do all of her daily activities without restriction.  She feels the palpitations were due to a very stressful point in her life.  No changes.  2.  Hypertension: Blood pressure is currently well controlled.  No changes at this time.  She is doing quite well today.  She has not had any cardiac symptoms.  I Lanique Gonzalo see her back on an as-needed basis.  Current medicines are reviewed at length with the patient today.   The patient does not have concerns regarding her medicines.  The following changes were made today: None  Labs/ tests ordered today include:  Orders Placed This Encounter  Procedures  . EKG 12-Lead     Disposition:   FU with Maleki Hippe as needed months  Signed, Maxamillion Banas Meredith Leeds, MD  03/15/2019 9:30 AM     Samantha Hobbs [id number] [phone] (office) [phone] (fax)

## 2019-03-15 NOTE — Patient Instructions (Signed)
Medication Instructions:  Your physician recommends that you continue on your current medications as directed. Please refer to the Current Medication list given to you today.  * If you need a refill on your cardiac medications before your next appointment, please call your pharmacy.   Labwork: None ordered  Testing/Procedures: None ordered  Follow-Up: No follow up is needed at this time with Dr. Camnitz.  He will see you on an as needed basis.   Thank you for choosing [facility]!!   Sherri Price, RN [phone]     

## 2020-01-06 ENCOUNTER — Other Ambulatory Visit: Payer: Self-pay

## 2020-01-06 ENCOUNTER — Ambulatory Visit
Admission: RE | Admit: 2020-01-06 | Discharge: 2020-01-06 | Disposition: A | Discharge status: Home / Self Care | Payer: BC Managed Care – PPO | Source: Ambulatory Visit | Attending: Family Medicine | Admitting: Family Medicine

## 2020-01-06 ENCOUNTER — Other Ambulatory Visit: Payer: Self-pay | Admitting: Family Medicine

## 2020-01-06 DIAGNOSIS — Z1231 Encounter for screening mammogram for malignant neoplasm of breast: Secondary | ICD-10-CM

## 2021-05-21 ENCOUNTER — Other Ambulatory Visit: Payer: Self-pay | Admitting: Family Medicine

## 2021-05-21 DIAGNOSIS — Z1231 Encounter for screening mammogram for malignant neoplasm of breast: Secondary | ICD-10-CM

## 2021-06-04 IMAGING — MG DIGITAL SCREENING BILAT W/ TOMO W/ CAD
6 series · 6 of 14 positions shown · non-contrast
Comparison: Previous exam(s).

CLINICAL DATA: Screening.

EXAM:
DIGITAL SCREENING BILATERAL MAMMOGRAM WITH TOMO AND CAD

[R CC synth-2D]
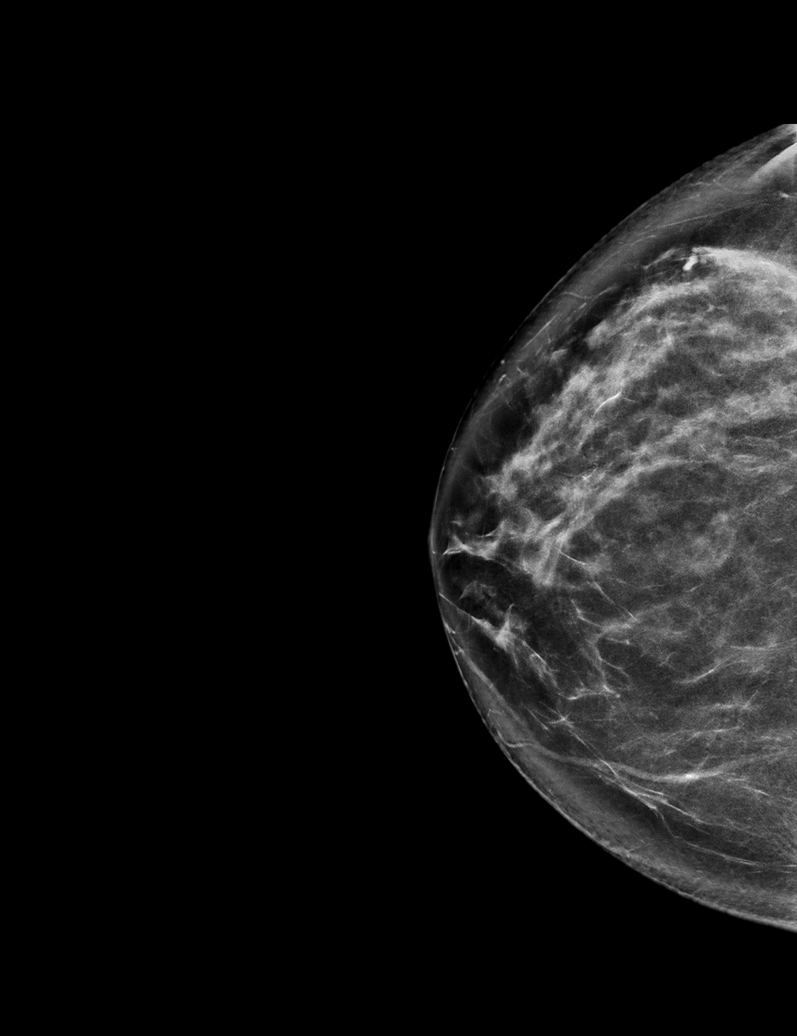

[L MLO synth-2D]
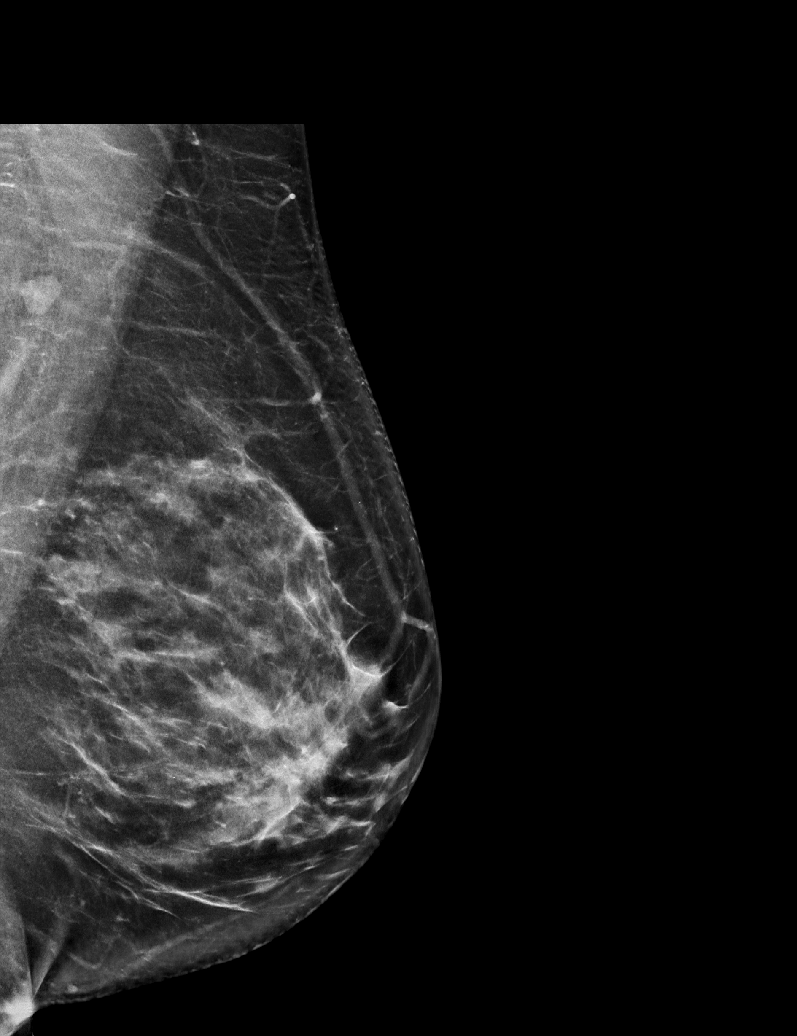

[L CC synth-2D (1 of 2)]
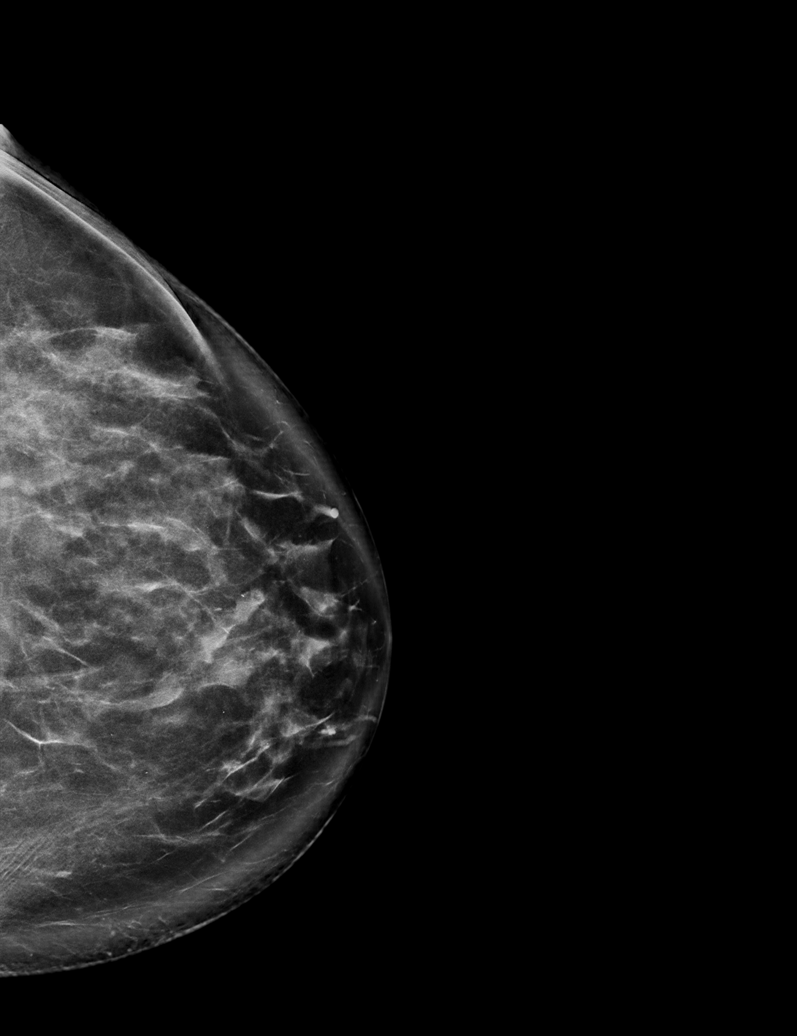

[L CC synth-2D (2 of 2)]
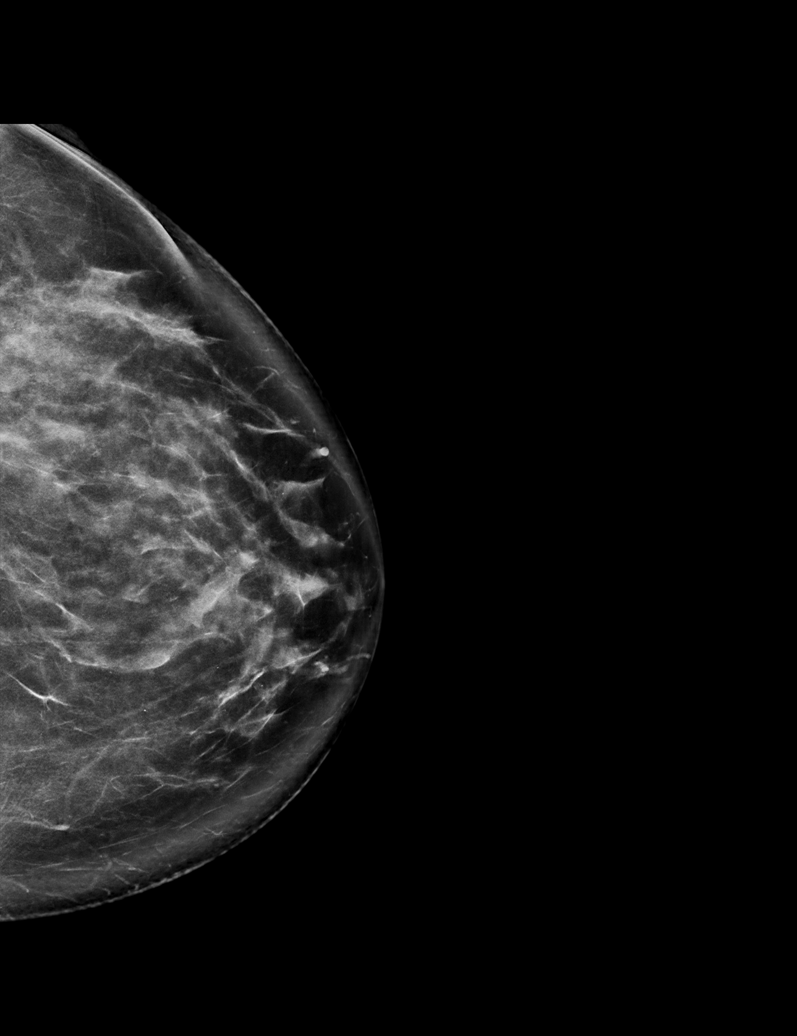

[L MLO tomo · tomo slice 37/74.0]
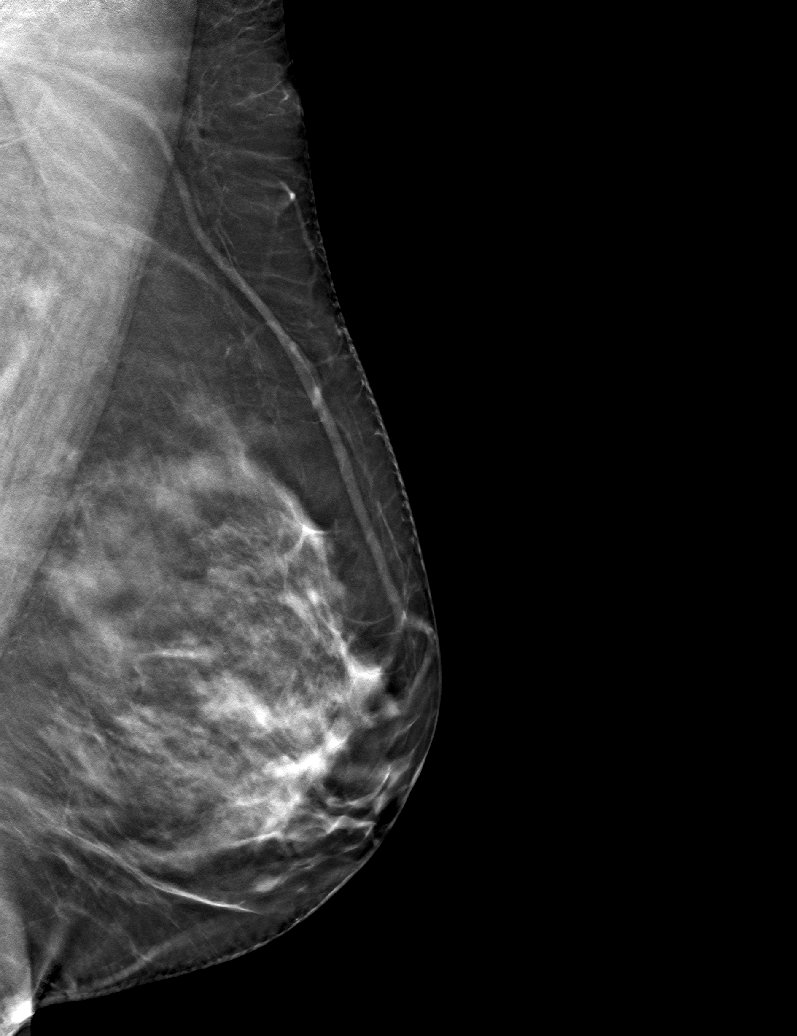

[R MLO tomo · tomo slice 35/70.0]
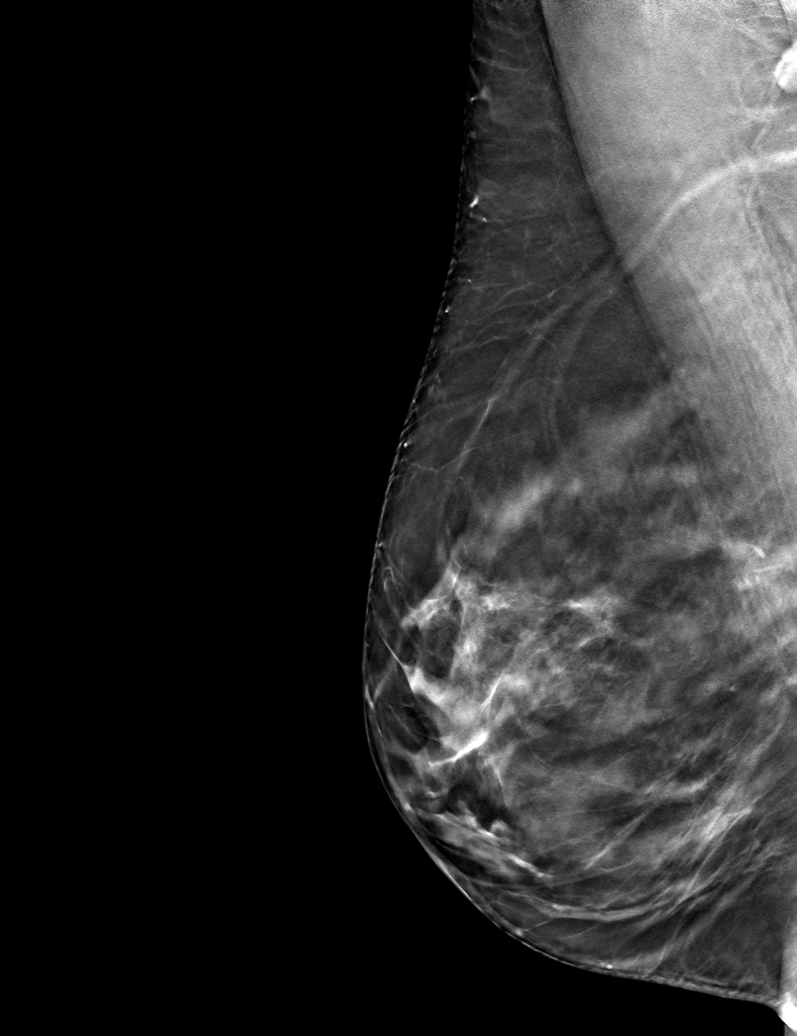

[6 of 14 positions shown; findings below may reference images not displayed]

ACR Breast Density Category c: The breast tissue is heterogeneously
dense, which may obscure small masses.
FINDINGS: There are no findings suspicious for malignancy. Images were
processed with CAD.
IMPRESSION: No mammographic evidence of malignancy. A result letter of this
screening mammogram will be mailed directly to the patient.

RECOMMENDATION:
Screening mammogram in one year. (Code:FT-U-LHB)

BI-RADS CATEGORY  1: Negative.

## 2021-06-19 ENCOUNTER — Ambulatory Visit
Admission: RE | Admit: 2021-06-19 | Discharge: 2021-06-19 | Disposition: A | Discharge status: Home / Self Care | Payer: BC Managed Care – PPO | Source: Ambulatory Visit | Attending: Family Medicine | Admitting: Family Medicine

## 2021-06-19 DIAGNOSIS — Z1231 Encounter for screening mammogram for malignant neoplasm of breast: Secondary | ICD-10-CM

## 2022-03-21 ENCOUNTER — Other Ambulatory Visit: Payer: Self-pay | Admitting: Family Medicine

## 2022-03-21 DIAGNOSIS — E2839 Other primary ovarian failure: Secondary | ICD-10-CM

## 2022-03-21 DIAGNOSIS — Z1231 Encounter for screening mammogram for malignant neoplasm of breast: Secondary | ICD-10-CM

## 2022-09-12 ENCOUNTER — Ambulatory Visit
Admission: RE | Admit: 2022-09-12 | Discharge: 2022-09-12 | Disposition: A | Discharge status: Home / Self Care | Payer: BC Managed Care – PPO | Source: Ambulatory Visit | Attending: Family Medicine | Admitting: Family Medicine

## 2022-09-12 DIAGNOSIS — E2839 Other primary ovarian failure: Secondary | ICD-10-CM

## 2022-09-12 DIAGNOSIS — Z1231 Encounter for screening mammogram for malignant neoplasm of breast: Secondary | ICD-10-CM

## 2023-04-15 ENCOUNTER — Other Ambulatory Visit: Payer: Self-pay | Admitting: Internal Medicine

## 2023-04-15 DIAGNOSIS — N6342 Unspecified lump in left breast, subareolar: Secondary | ICD-10-CM

## 2023-04-23 ENCOUNTER — Ambulatory Visit
Admission: RE | Admit: 2023-04-23 | Discharge: 2023-04-23 | Disposition: A | Discharge status: Home / Self Care | Payer: BC Managed Care – PPO | Source: Ambulatory Visit | Attending: Internal Medicine | Admitting: Internal Medicine

## 2023-04-23 DIAGNOSIS — N6342 Unspecified lump in left breast, subareolar: Secondary | ICD-10-CM

## 2023-12-14 ENCOUNTER — Ambulatory Visit: Admitting: Podiatry

## 2023-12-14 ENCOUNTER — Encounter: Payer: Self-pay | Admitting: Podiatry

## 2023-12-14 DIAGNOSIS — E1140 Type 2 diabetes mellitus with diabetic neuropathy, unspecified: Secondary | ICD-10-CM | POA: Diagnosis not present

## 2023-12-14 DIAGNOSIS — L600 Ingrowing nail: Secondary | ICD-10-CM | POA: Diagnosis not present

## 2023-12-14 NOTE — Progress Notes (Signed)
 Subjective:  Patient ID: Samantha Hobbs, female    DOB: Mar 26, 1961,  MRN: [id number]  Samantha Hobbs presents to clinic today for:  Chief Complaint  Patient presents with   Nail Problem    Here today for possible nail fungus, and deformity of the Left first nail. She does have it cut way back. This nail is also an ingrown on bilateral border which does cause her pain.  He last A1c is unknown. FBS 115 this morning. No anti coag.   Patient comes in for above complaint.  Reporting pain to left first toenail both borders and pain to the top of the nail.  States that she has had trouble with the nail in the past and that it has come off before and feels loose.  Has some mild discoloration associate with this as well.  She states that stops growing past a certain point centrally.  She would like to discuss options for this today.  She is diabetic, reports good control of her blood sugar states that her A1c is around or slightly below 7.  Total nail avulsion left.  Does also report sometimes having some soreness right first toe to the interspace and has some discoloration to the right hallux distal medial aspect of the nail plate.  She does report some intermittent numbness and tingling to the toes.  PCP is Pahwani, Rinka R, MD.  No Known Allergies  Review of Systems: Negative except as noted in the HPI.  Objective:  There were no vitals filed for this visit.  Samantha Hobbs is a pleasant 63 y.o. female in NAD. AAO x 3.  Vascular Examination: Capillary refill time is less than 3 seconds to toes bilateral. Palpable pedal pulses b/l LE. Digital hair present b/l. No pedal edema b/l. Skin temperature gradient WNL b/l. No varicosities b/l. No cyanosis or clubbing noted b/l.   Dermatological Examination: There is incurvation of the left hallux bilateral nail border.  Nail plate is somewhat loose.  Central aspect is somewhat lifted off of the nailbed.  Some discoloration present to central  aspect of the nail distally. The entire nail is tender on palpation most tender over the borders.  Right hallux nail plate distal medial aspect there is focal area of discoloration with yellowish changes.  Neurological Examination: Protective sensation intact with Semmes-Weinstein 10 gram monofilament b/l LE. Vibratory sensation decreased b/l LE.  Subjective paresthesias reported.      No data to display           Assessment/Plan: 1. Controlled type 2 diabetes mellitus with neuropathy (HCC)   2. Ingrown toenail of left foot     No orders of the defined types were placed in this encounter.   Discussed patient's condition today.  After obtaining patient consent, the left hallux was anesthetized with a 50:50 mixture of 1% lidocaine plain and 0.5% bupivacaine plain for a total of 3cc's administered.  Upon confirmation of anesthesia, a freer elevator was utilized to free the left hallux nail plate nail plate from the nail bed.  The nail border was then avulsed proximal to the eponychium and removed in toto.  The area was inspected for any remaining spicules.  A chemical matrixectomy was performed with NaOH and neutralized with acetic acid solution.  Antibiotic ointment and a DSD were applied, followed by a Coban dressing.  Patient tolerated the anesthetic and procedure well and will f/u in 2-3 weeks for recheck.  Patient given post-procedure instructions  for daily 20-minute Epsom salt soaks, antibiotic ointment and daily use of Bandaids until toe starts to dry / form eschar.   Patient educated on diabetes. Discussed proper diabetic foot care and discussed risks and complications of disease. Educated patient in depth on reasons to return to the office immediately should he/she discover anything concerning or new on the feet. All questions answered. Discussed proper shoes as well.  Will have comprehensive diabetic foot evaluation at follow up.   Return in about 2 weeks (around 12/28/2023) for  Nail Check, baseline diabetic foot evaluation.   Eve Hinders, DPM, AACFAS [facility]     [address] [place], [place] [address]                Office [phone]  Fax [phone]

## 2023-12-14 NOTE — Patient Instructions (Signed)
 Place 1/4 cup of epsom salts in a quart of warm tap water.  Submerge your foot or feet in the solution and soak for 20 minutes.  This soak should be done twice a day.  Next, remove your foot or feet from solution, blot dry the affected area. Apply ointment and cover if instructed by your doctor.   IF YOUR SKIN BECOMES IRRITATED WHILE USING THESE INSTRUCTIONS, IT IS OKAY TO SWITCH TO  WHITE VINEGAR AND WATER.  As another alternative soak, you may use antibacterial soap and water.  Monitor for any signs/symptoms of infection. Call the office immediately if any occur or go directly to the emergency room. Call with any questions/concerns.  You may apply a small amount of antibiotic ointment to the nail avulsion site for the first 5 to 7 days.  After this time discontinue the use of any antibiotic ointment.  Continue to soak and bandage the toe until a dry scab starts to form.  Allow toe to air dry.

## 2023-12-30 ENCOUNTER — Ambulatory Visit: Admitting: Podiatry

## 2023-12-30 DIAGNOSIS — M7752 Other enthesopathy of left foot: Secondary | ICD-10-CM | POA: Diagnosis not present

## 2023-12-30 DIAGNOSIS — M2041 Other hammer toe(s) (acquired), right foot: Secondary | ICD-10-CM | POA: Diagnosis not present

## 2023-12-30 DIAGNOSIS — M7751 Other enthesopathy of right foot: Secondary | ICD-10-CM

## 2023-12-30 DIAGNOSIS — M2042 Other hammer toe(s) (acquired), left foot: Secondary | ICD-10-CM | POA: Diagnosis not present

## 2023-12-30 DIAGNOSIS — E119 Type 2 diabetes mellitus without complications: Secondary | ICD-10-CM

## 2023-12-30 NOTE — Progress Notes (Signed)
 Chief Complaint  Patient presents with   Nail Check    Left total nail removed 2 weeks ago, it looks good. She has soaked every day and kept covered, no real pain in the toe. She would also like diabetic foot exam. Last A1c 7 in Feb/March. No anti coag.    HPI: 63 y.o. female presents today for follow-up of an avulsion of the left hallux toenail.  She is also here for diabetic foot check.  Patient has been applying antibiotic ointment to the nail avulsion site.  Minimal tenderness noted by patient.  She has been wearing open toed shoes.  She notes that she has a condition where she develops multiple bone spurs on her body.  Denies any burning in the feet.  Notes some occasional tingling/numbness which is intermittent and not bilateral / symmetrical  Past Medical History:  Diagnosis Date   Abscess    Arthritis    Diabetes mellitus without complication (HCC)    Hyperlipidemia    Hypertension    Past Surgical History:  Procedure Laterality Date   ABDOMINAL HYSTERECTOMY  2007   supercervical   GANGLION CYST EXCISION  1999   No Known Allergies   Physical Exam: General: The patient is alert and oriented x3 in no acute distress.  Dermatology: Skin is warm, dry and supple bilateral lower extremities. Interspaces are clear of maceration and debris.  The left hallux avulsion site has granular base with some maceration along the periphery.  No clinical signs of infection are noted.  Some tenderness on palpation is noted.  Vascular: Palpable pedal pulses bilaterally. Capillary refill within normal limits.  No appreciable edema.  No erythema or calor.  Neurological: Light touch sensation grossly intact bilateral feet.  Protective sensation intact using a Semmes Weinstein monofilament bilateral.  Vibratory sensation intact bilateral forefoot.  Musculoskeletal Exam: Patient has a flexible pes cavus deformity with flexible digital contractures of toes 2 through 5 bilateral.  Manual muscle  testing 5/5 bilateral.  Visible bony prominence on the dorsal medial aspect of the hallux IPJ bilateral as well as the medial navicular area bilateral.  Assessment/Plan of Care: 1. Encounter for diabetic foot exam (HCC)   2. Hammertoe of left foot   3. Hammertoe of right foot   4. Bone spur of left foot   5. Bone spur of right foot    Discussed clinical findings with patient today.  Patient will start letting the nail avulsion site dry out more.  Betadine solution and a Band-Aid were applied today.  She can leave it open to the air at home and when sleeping.  She can use iodine and a Band-Aid when in close toed shoes.  Once a dry eschar has formed she can discontinue postprocedure instructions.  Recommend diabetic foot check annually unless patient is developing any new symptoms.  She is comfortable trimming her own nails.  Recommended the diabetic shoe program to the patient today.  She does qualify due to her hammertoe deformities and diabetes.  Prescription was written and dispensed to the patient today.  She was informed that we will fax today's note as well as a copy of the order to [place] to start the diabetic shoe process for her.  Joe Murders, DPM, FACFAS [facility]     [address]  [place], [place] [address]                Office [phone]  Fax [phone]
